# Patient Record
Sex: Male | Born: 1985 | Hispanic: No | Marital: Married | Smoking: Former smoker
Health system: Southern US, Community
[De-identification: ages and names within clinical notes are randomized; demographics above are authoritative.]

## PROBLEM LIST (undated history)

## (undated) ENCOUNTER — Ambulatory Visit: Admission: EM | Disposition: A | Discharge status: Home / Self Care | Payer: Medicaid Other

---

## 2012-09-27 ENCOUNTER — Emergency Department (HOSPITAL_BASED_OUTPATIENT_CLINIC_OR_DEPARTMENT_OTHER): Payer: Medicaid Other

## 2012-09-27 ENCOUNTER — Encounter (HOSPITAL_BASED_OUTPATIENT_CLINIC_OR_DEPARTMENT_OTHER): Payer: Self-pay | Admitting: *Deleted

## 2012-09-27 ENCOUNTER — Emergency Department (HOSPITAL_BASED_OUTPATIENT_CLINIC_OR_DEPARTMENT_OTHER)
Admission: EM | Admit: 2012-09-27 | Discharge: 2012-09-27 | Disposition: A | Discharge status: Home / Self Care | Payer: Medicaid Other | Attending: Emergency Medicine | Admitting: Emergency Medicine

## 2012-09-27 DIAGNOSIS — F17200 Nicotine dependence, unspecified, uncomplicated: Secondary | ICD-10-CM | POA: Insufficient documentation

## 2012-09-27 DIAGNOSIS — J189 Pneumonia, unspecified organism: Secondary | ICD-10-CM

## 2012-09-27 DIAGNOSIS — J159 Unspecified bacterial pneumonia: Secondary | ICD-10-CM | POA: Insufficient documentation

## 2012-09-27 DIAGNOSIS — J3489 Other specified disorders of nose and nasal sinuses: Secondary | ICD-10-CM | POA: Insufficient documentation

## 2012-09-27 DIAGNOSIS — R0989 Other specified symptoms and signs involving the circulatory and respiratory systems: Secondary | ICD-10-CM | POA: Insufficient documentation

## 2012-09-27 DIAGNOSIS — H9319 Tinnitus, unspecified ear: Secondary | ICD-10-CM | POA: Insufficient documentation

## 2012-09-27 MED ORDER — IOHEXOL 300 MG/ML  SOLN
80.0000 mL | Freq: Once | INTRAMUSCULAR | Status: AC | PRN
  Administered 2012-09-27: 80 mL via INTRAVENOUS

## 2012-09-27 MED ORDER — AZITHROMYCIN 250 MG PO TABS: 250.0000 mg | ORAL_TABLET | Freq: Every day | ORAL | Status: AC

## 2012-09-27 NOTE — ED Provider Notes (Signed)
Medical screening examination/treatment/procedure(s) were performed by non-physician practitioner and as supervising physician I was immediately available for consultation/collaboration.   Rolan Bucco, MD 09/27/12 [phone]

## 2012-09-27 NOTE — ED Provider Notes (Signed)
History    CSN: [id number] Arrival date & time 09/27/12  1609  First MD Initiated Contact with Patient 09/27/12 1623     Chief Complaint  Patient presents with  . URI   (Consider location/radiation/quality/duration/timing/severity/associated sxs/prior Treatment) HPI Comments: Patient presents to emergency department with chief complaint of nasal congestion, tinnitus, and chest congestion x2 weeks. He denies running any fever, but does endorse productive green sputum. He states that he is tried taking his friend's amoxicillin with some relief. He only took it for 6 days. He has also tried OTC cough and cold medicine with some relief. However, he states that the nasal congestion and sinus pressure has been worsening. He denies any other health problems  The history is provided by the patient. No language interpreter was used.   History reviewed. No pertinent past medical history. History reviewed. No pertinent past surgical history. No family history on file. History  Substance Use Topics  . Smoking status: Current Every Day Smoker -- 0.50 packs/day    Types: Cigarettes  . Smokeless tobacco: Not on file  . Alcohol Use: No    Review of Systems  All other systems reviewed and are negative.    Allergies  Review of patient's allergies indicates no known allergies.  Home Medications   Current Outpatient Rx  Name  Route  Sig  Dispense  Refill  . AMOXICILLIN PO   Oral   Take by mouth.          BP 129/79  Pulse 80  Temp(Src) 98.9 F (37.2 C) (Oral)  Resp 18  Ht 5\' 6"  (1.676 m)  Wt 135 lb (61.236 kg)  BMI 21.8 kg/m2  SpO2 99% Physical Exam  Nursing note and vitals reviewed. Constitutional: He is oriented to person, place, and time. He appears well-developed and well-nourished.  HENT:  Head: Normocephalic and atraumatic.  Right Ear: External ear normal.  Left Ear: External ear normal.  Nose: Nose normal.  Mouth/Throat: Oropharynx is clear and moist. No  oropharyngeal exudate.  Swollen, erythematous turbinates, maxillary sinuses tender to palpation  Eyes: Conjunctivae and EOM are normal. Pupils are equal, round, and reactive to light. Right eye exhibits no discharge. Left eye exhibits no discharge. No scleral icterus.  Neck: Normal range of motion. Neck supple. No JVD present.  Cardiovascular: Normal rate, regular rhythm, normal heart sounds and intact distal pulses.  Exam reveals no gallop and no friction rub.   No murmur heard. Pulmonary/Chest: Effort normal and breath sounds normal. No respiratory distress. He has no wheezes. He has no rales. He exhibits no tenderness.  Abdominal: Soft. Bowel sounds are normal. He exhibits no distension and no mass. There is no tenderness. There is no rebound and no guarding.  Musculoskeletal: Normal range of motion. He exhibits no edema and no tenderness.  Neurological: He is alert and oriented to person, place, and time.  Skin: Skin is warm and dry.  Psychiatric: He has a normal mood and affect. His behavior is normal. Judgment and thought content normal.    ED Course  Procedures (including critical care time) No results found for this or any previous visit. Dg Chest 2 View  09/27/2012   *RADIOLOGY REPORT*  Clinical Data: Cough, congestion for 1 week.  History of smoking.  CHEST - 2 VIEW  Comparison: None.  Findings: Cardiomediastinal silhouette is within normal limits. The lungs are free of focal consolidations and pleural effusions. At the right lung base, there is question of a pulmonary nodule, located posteriorly  on the lateral view.  Further evaluation with chest CT is recommended.  Bony structures have a normal appearance.  IMPRESSION:  1.  Question of right lower lobe pulmonary nodule.  CT of the chest with contrast is recommended for further evaluation. 2.  No focal consolidations or pulmonary edema.   Original Report Authenticated By: Norva Pavlov, M.D.   Ct Chest W Contrast  09/27/2012    *RADIOLOGY REPORT*  Clinical Data: 27 year old patient with cough and congestion for 1 week.  Possible nodules seen on today's chest radiograph.  CT CHEST WITH CONTRAST  Technique:  Multidetector CT imaging of the chest was performed following the standard protocol during bolus administration of intravenous contrast.  Contrast: 80mL OMNIPAQUE IOHEXOL 300 MG/ML  SOLN  Comparison: 09/27/2012 chest radiograph  Findings: Normal heart size.  Thoracic aorta and proximal great vessels are normal in caliber and enhancement.  Negative for mediastinal, supraclavicular, hilar, or axillary lymphadenopathy. Negative for pleural or pericardial effusion.  Soft tissues of the chest wall are normal.  In the inferior right lower lobe there are clustered micronodules, as well as two larger nodules posteriorly. The largest nodule is positioned adjacent to the hemidiaphragm measures 10 x 10 mm and is felt to account for the nodule seen on chest radiograph.  The next largest nodular density measures approximately 7 x 5 mm on image number 55 of the lung windows. These are favored to be airspace nodules related to infection or inflammation.  The right upper lobe, right middle lobe, and left lung are well expanded and clear.  The trachea and mainstem bronchi are patent.  No acute findings are identified in the visualized portion of the upper abdomen.  No acute or suspicious bony abnormality.  IMPRESSION: Cluster of nodular densities in the right lower lobe inferiorly are likely due to focal infection or inflammation.  The largest nodule measures 10 x 10 mm and is felt to account for the abnormality seen on the chest radiograph.  Follow-up noncontrast chest CT in 2-3 months is suggested following medical therapy for patient's cough.   Original Report Authenticated By: Britta Mccreedy, M.D.     1. CAP (community acquired pneumonia)     MDM  Patient with cough x2 weeks, also complaining of green productive sputum. Will order a chest x-ray  to evaluate for possible pneumonia.  5:36 PM Chest x-ray shows a concerning lower lung nodule, and radiologist recommended CT scan for further evaluation. CT is pending. Discussed with Dr. Fredderick Phenix, who agrees with the plan.  6:18 PM CT shows evidence of pneumonia. Patient discussed with Dr. Fredderick Phenix, who recommends azithromycin. Patient is stable and ready for discharge. Return precautions have been given.  Roxy Horseman, PA-C 09/27/12 1819

## 2012-09-27 NOTE — ED Notes (Addendum)
Congestion, cough and ear pain. Has been taking a friends Amoxicillin x 6 days.

## 2014-03-06 IMAGING — CT CT CHEST W/ CM
2 of 3 series · 15 of 36 positions shown, 18 images · IV contrast (omnipaque)
Comparison: 09/27/2012 chest radiograph

CLINICAL DATA: 26-year-old patient with cough and congestion for 1
week.  Possible nodules seen on today's chest radiograph.

CT CHEST WITH CONTRAST
TECHNIQUE: Multidetector CT imaging of the chest was performed
following the standard protocol during bolus administration of
intravenous contrast.
Contrast: 80mL OMNIPAQUE IOHEXOL 300 MG/ML  SOLN

[Series 2: chest 5.0 b31f · axial · 0.77mm/px · z∈[-328,-28]mm · 12 of 72 slices shown, 15 images]
[im 6/72  mediastinal]
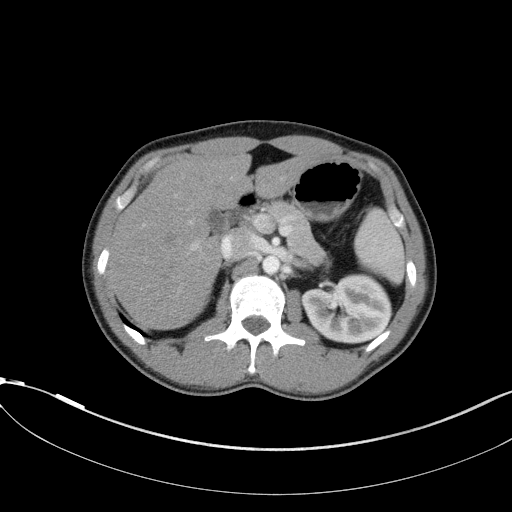
[im 6/72  lung]
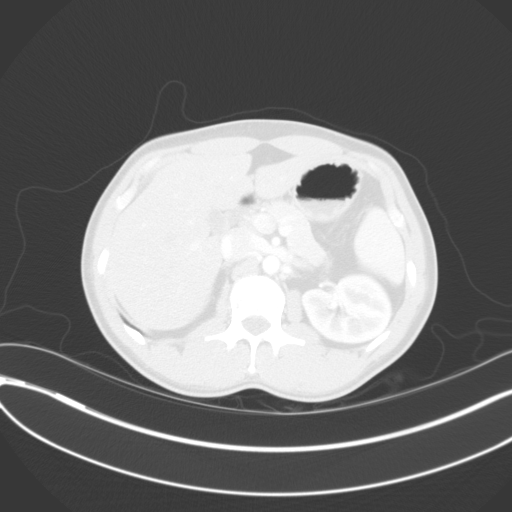
[im 11/72  lung]
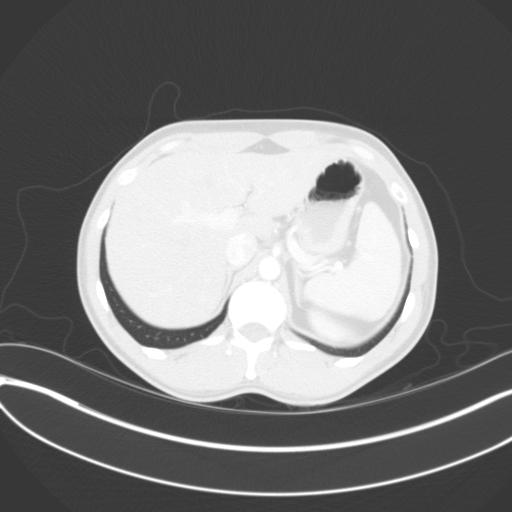
[im 16/72  lung]
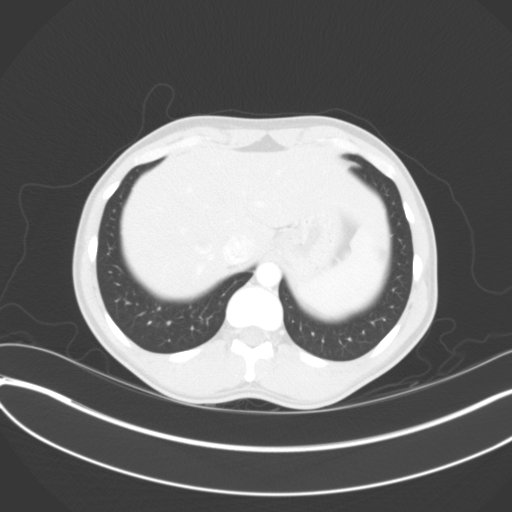
[im 22/72  lung]
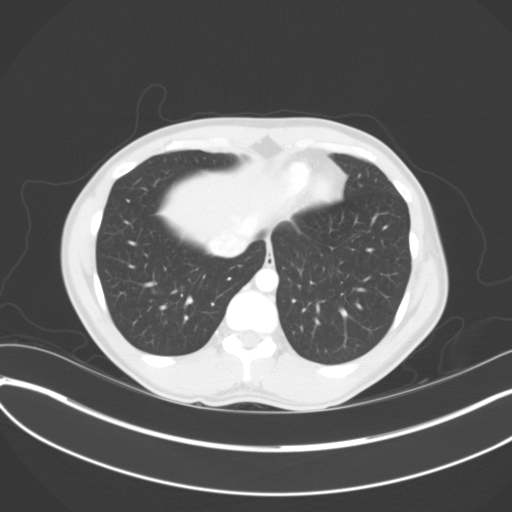
[im 27/72  mediastinal]
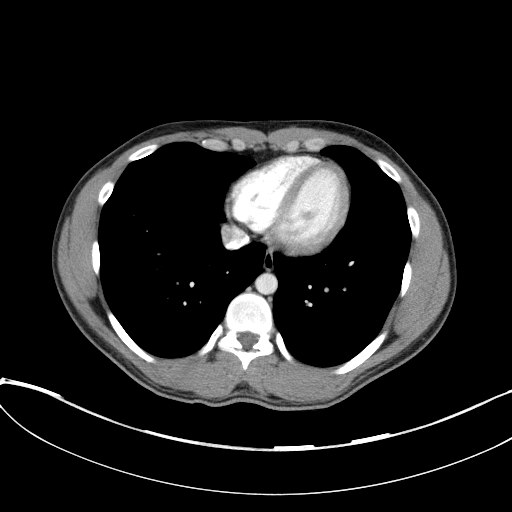
[im 27/72  lung]
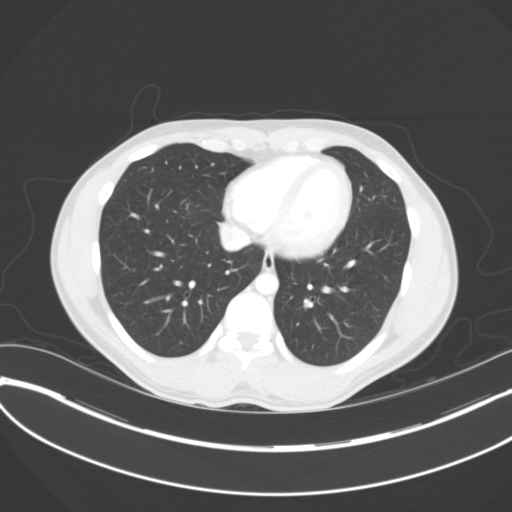
[im 32/72  lung]
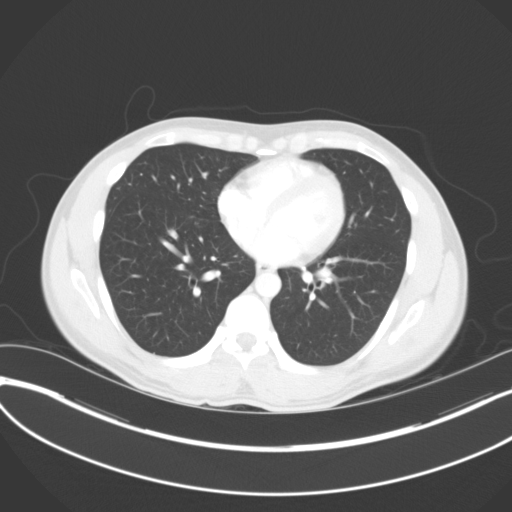
[im 40/72  lung]
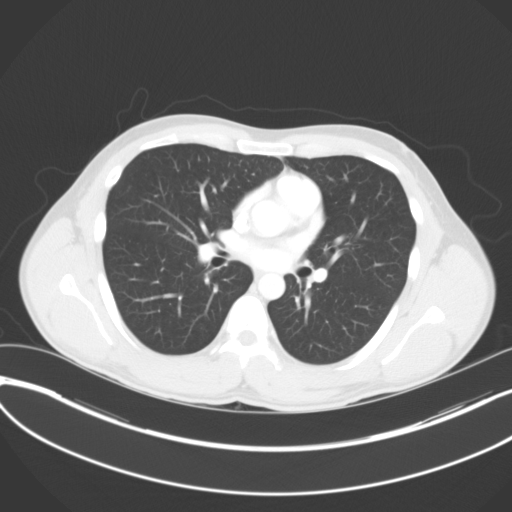
[im 45/72  lung]
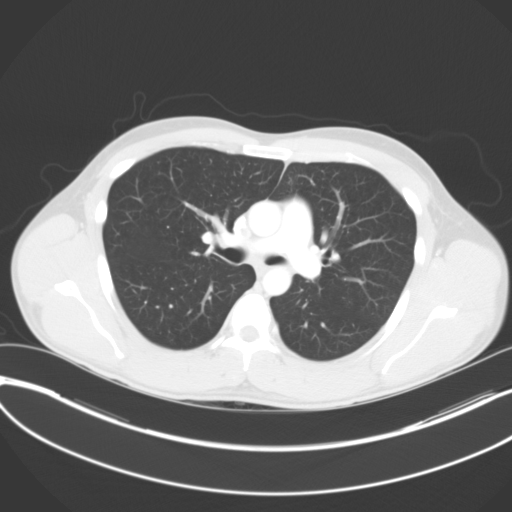
[im 50/72  mediastinal]
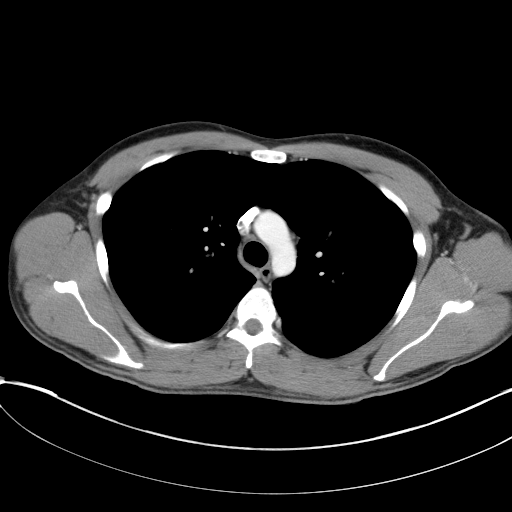
[im 50/72  lung]
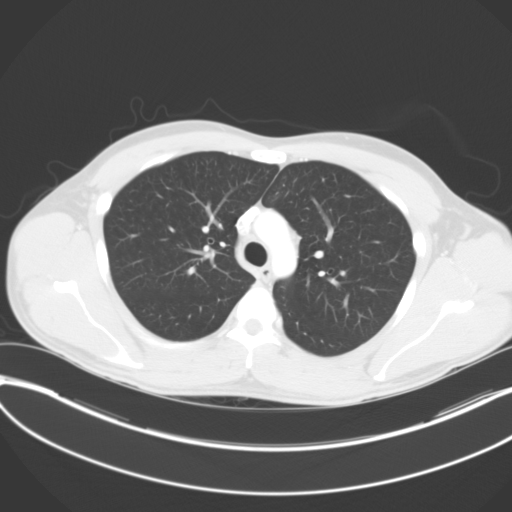
[im 56/72  lung]
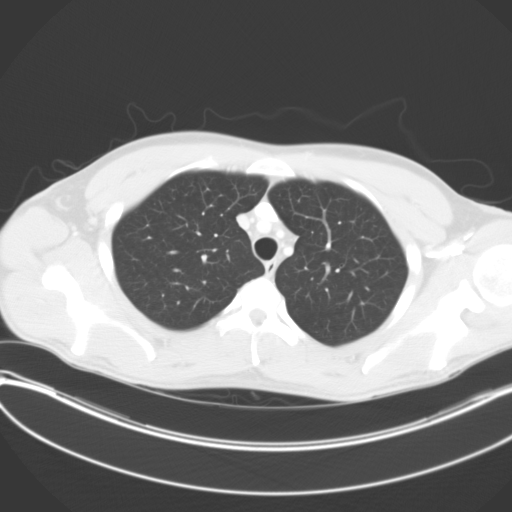
[im 61/72  lung]
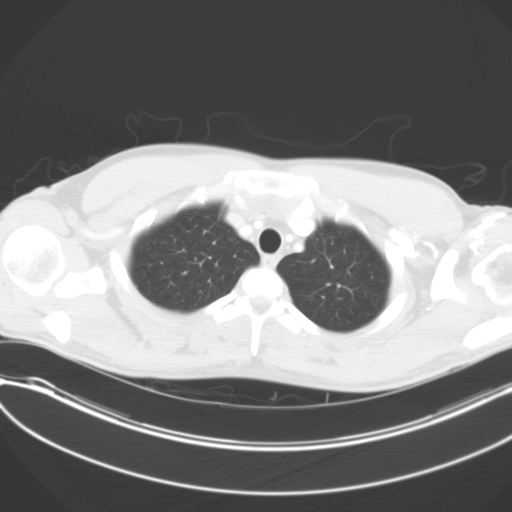
[im 66/72  lung]
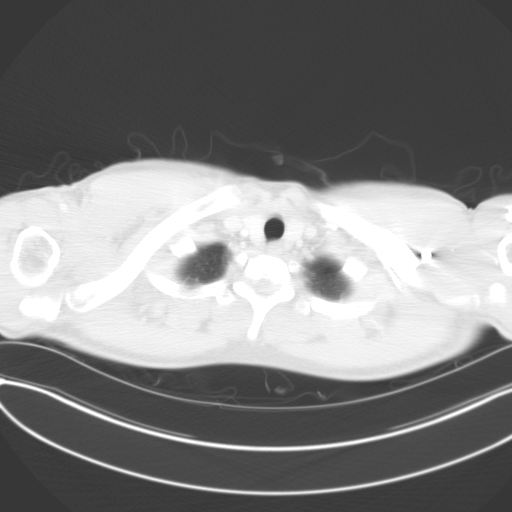

[Series 6: chest 3.0 coronal · coronal · 0.70mm/px · 3 of 75 slices shown]
[im 15/75  lung]
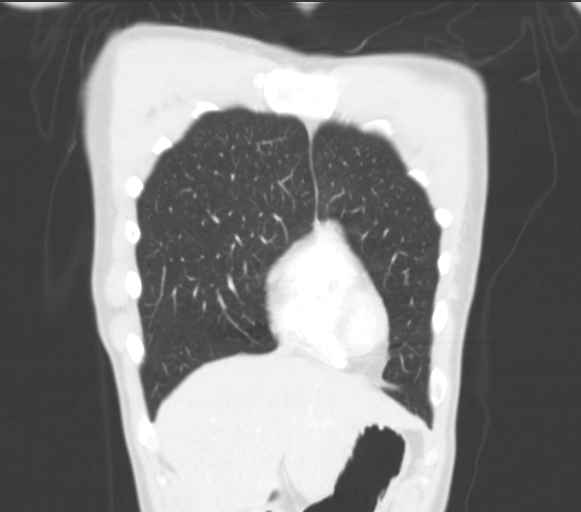
[im 30/75  lung]
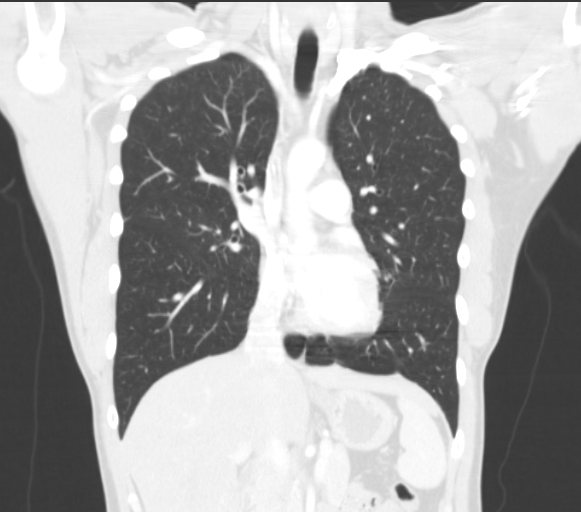
[im 45/75  lung]
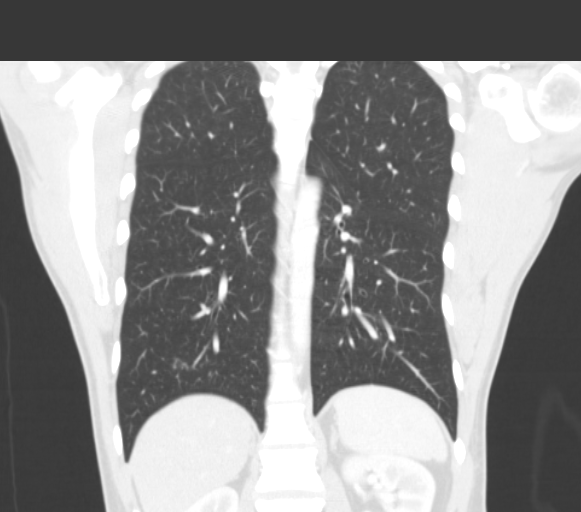

[15 of 36 positions shown; findings below may reference images not displayed]

FINDINGS: Normal heart size.  Thoracic aorta and proximal great
vessels are normal in caliber and enhancement.  Negative for
mediastinal, supraclavicular, hilar, or axillary lymphadenopathy.
Negative for pleural or pericardial effusion.  Soft tissues of the
chest wall are normal.

In the inferior right lower lobe there are clustered micronodules,
as well as two larger nodules posteriorly. The largest nodule is
positioned adjacent to the hemidiaphragm measures 10 x 10 mm and is
felt to account for the nodule seen on chest radiograph.  The next
largest nodular density measures approximately 7 x 5 mm on image
number 55 of the lung windows. These are favored to be airspace
nodules related to infection or inflammation.

The right upper lobe, right middle lobe, and left lung are well
expanded and clear.  The trachea and mainstem bronchi are patent.

No acute findings are identified in the visualized portion of the
upper abdomen.

No acute or suspicious bony abnormality.
IMPRESSION: Cluster of nodular densities in the right lower lobe
inferiorly are likely due to focal infection or inflammation.  The
largest nodule measures 10 x 10 mm and is felt to account for the
abnormality seen on the chest radiograph.  Follow-up noncontrast
chest CT in 2-3 months is suggested following medical therapy for
patient's cough.

## 2017-07-07 ENCOUNTER — Encounter (HOSPITAL_BASED_OUTPATIENT_CLINIC_OR_DEPARTMENT_OTHER): Payer: Self-pay

## 2017-07-07 ENCOUNTER — Emergency Department (HOSPITAL_BASED_OUTPATIENT_CLINIC_OR_DEPARTMENT_OTHER)
Admission: EM | Admit: 2017-07-07 | Discharge: 2017-07-07 | Disposition: A | Discharge status: Home / Self Care | Payer: Medicaid Other | Attending: Emergency Medicine | Admitting: Emergency Medicine

## 2017-07-07 ENCOUNTER — Emergency Department (HOSPITAL_BASED_OUTPATIENT_CLINIC_OR_DEPARTMENT_OTHER): Payer: Medicaid Other

## 2017-07-07 ENCOUNTER — Other Ambulatory Visit: Payer: Self-pay

## 2017-07-07 DIAGNOSIS — Z87891 Personal history of nicotine dependence: Secondary | ICD-10-CM | POA: Insufficient documentation

## 2017-07-07 DIAGNOSIS — S61211A Laceration without foreign body of left index finger without damage to nail, initial encounter: Secondary | ICD-10-CM | POA: Diagnosis not present

## 2017-07-07 DIAGNOSIS — Y929 Unspecified place or not applicable: Secondary | ICD-10-CM | POA: Insufficient documentation

## 2017-07-07 DIAGNOSIS — Y999 Unspecified external cause status: Secondary | ICD-10-CM | POA: Insufficient documentation

## 2017-07-07 DIAGNOSIS — W260XXA Contact with knife, initial encounter: Secondary | ICD-10-CM | POA: Diagnosis not present

## 2017-07-07 DIAGNOSIS — Y9389 Activity, other specified: Secondary | ICD-10-CM | POA: Insufficient documentation

## 2017-07-07 MED ORDER — LIDOCAINE HCL 2 % IJ SOLN
INTRAMUSCULAR | Status: AC
  Filled 2017-07-07: qty 20

## 2017-07-07 MED ORDER — TETANUS-DIPHTH-ACELL PERTUSSIS 5-2.5-18.5 LF-MCG/0.5 IM SUSP
INTRAMUSCULAR | Status: AC
  Filled 2017-07-07: qty 0.5

## 2017-07-07 MED ORDER — TETANUS-DIPHTH-ACELL PERTUSSIS 5-2.5-18.5 LF-MCG/0.5 IM SUSP
0.5000 mL | Freq: Once | INTRAMUSCULAR | Status: AC
  Administered 2017-07-07: 0.5 mL via INTRAMUSCULAR

## 2017-07-07 NOTE — ED Provider Notes (Signed)
[facility] EMERGENCY DEPARTMENT Provider Note   CSN: [id number] Arrival date & time: 07/07/17  1445     History   Chief Complaint Chief Complaint  Patient presents with  . Finger Injury    HPI Omar Hayes is a 32 y.o. right-handed male who presents with laceration to L index finger that occurred just PTA. He reports he was trying to cut plastic with a knife and slipped and hit his finger. He has had associated paresthesia to the finger. Bleeding was controlled quickly with pressure. He denies any other injury. His tetanus is not UTD.  HPI  History reviewed. No pertinent past medical history.  There are no active problems to display for this patient.   History reviewed. No pertinent surgical history.      Home Medications    Prior to Admission medications   Medication Sig Start Date End Date Taking? Authorizing Provider  AMOXICILLIN PO Take by mouth.    [provider]  azithromycin (ZITHROMAX Z-PAK) 250 MG tablet Take 1 tablet (250 mg total) by mouth daily.  PO day 1, then  PO days 205 09/27/12   Roxy Horseman, PA-C    Family History No family history on file.  Social History Social History   Tobacco Use  . Smoking status: Former Smoker    Packs/day: 0.50    Types: Cigarettes  . Smokeless tobacco: Never Used  Substance Use Topics  . Alcohol use: No  . Drug use: No     Allergies   Patient has no known allergies.   Review of Systems Review of Systems  Constitutional: Negative for chills and fever.  HENT: Negative for facial swelling and sore throat.   Respiratory: Negative for shortness of breath.   Cardiovascular: Negative for chest pain.  Gastrointestinal: Negative for abdominal pain, nausea and vomiting.  Genitourinary: Negative for dysuria.  Musculoskeletal: Negative for back pain.  Skin: Positive for wound. Negative for rash.  Neurological: Positive for numbness (paresthesia). Negative for headaches.    Psychiatric/Behavioral: The patient is not nervous/anxious.      Physical Exam Updated Vital Signs BP 126/84 (BP Location: Right Arm)   Pulse 68   Temp 98.2 F (36.8 C) (Oral)   Resp 16   Ht  (1.676 m)   Wt 73.8 kg (162 lb 11.2 oz)   SpO2 100%   BMI 26.26 kg/m   Physical Exam  Constitutional: He appears well-developed and well-nourished. No distress.  HENT:  Head: Normocephalic and atraumatic.  Mouth/Throat: Oropharynx is clear and moist. No oropharyngeal exudate.  Eyes: Pupils are equal, round, and reactive to light. Conjunctivae are normal. Right eye exhibits no discharge. Left eye exhibits no discharge. No scleral icterus.  Neck: Normal range of motion. Neck supple. No thyromegaly present.  Cardiovascular: Normal rate, regular rhythm, normal heart sounds and intact distal pulses. Exam reveals no gallop and no friction rub.  No murmur heard. Pulmonary/Chest: Effort normal and breath sounds normal. No stridor. No respiratory distress. He has no wheezes. He has no rales.  Abdominal: Soft. Bowel sounds are normal. He exhibits no distension. There is no tenderness. There is no rebound and no guarding.  Musculoskeletal: He exhibits tenderness. He exhibits no edema.  Linear laceration with visible adipose to the palmar aspect of the L index finger 1 cm distal to and parallel with the DIP; FROM with flexion and extension of the PIP and DIP joints of the finer, cap refill <2 secs, sensation intact  Lymphadenopathy:  He has no cervical adenopathy.  Neurological: He is alert. Coordination normal.  Skin: Skin is warm and dry. No rash noted. He is not diaphoretic. No pallor.  Psychiatric: He has a normal mood and affect.  Nursing note and vitals reviewed.    ED Treatments / Results  Labs (all labs ordered are listed, but only abnormal results are displayed) Labs Reviewed - No data to display  EKG None  Radiology Dg Finger Index Left  Addendum Date: 07/07/2017    ADDENDUM REPORT: 07/07/2017 16:06 ADDENDUM: A repeat frontal view was obtained with the gauze removed. This demonstrates a thin soft tissue laceration in the radial aspect of the index finger at the level of the base of the 2nd distal phalanx. No fracture or radiopaque foreign body is seen. Electronically Signed   By: Beckie Salts M.D.   On: 07/07/2017 16:06   Result Date: 07/07/2017 CLINICAL DATA:  Distal left index finger laceration from a plastic injury. EXAM: LEFT INDEX FINGER 2+V COMPARISON:  None. FINDINGS: The examination is limited by overlying gauze. This makes it difficult to exclude a fracture on the AP view. No fracture seen on the other views. No dislocation. IMPRESSION: Limited examination due to overlying gauze with no definite fracture seen. A repeat examination is recommended once the gauze has been removed. Electronically Signed: By: Beckie Salts M.D. On: 07/07/2017 15:19    Procedures .Marland KitchenLaceration Repair Date/Time: 07/07/2017 5:10 PM Performed by: Emi Holes, PA-C Authorized by: Emi Holes, PA-C   Consent:    Consent obtained:  Verbal   Consent given by:  Patient   Risks discussed:  Infection and pain   Alternatives discussed:  No treatment Anesthesia (see MAR for exact dosages):    Anesthesia method:  Nerve block   Block location:  Digital- L index   Block needle gauge:  25 G   Block anesthetic:  Lidocaine 2% w/o epi   Block technique:  Transthecal   Block injection procedure:  Anatomic landmarks identified, introduced needle, incremental injection, anatomic landmarks palpated and negative aspiration for blood   Block outcome:  Anesthesia achieved Laceration details:    Location:  Finger   Finger location:  L index finger   Length (cm):  2 Repair type:    Repair type:  Simple Pre-procedure details:    Preparation:  Patient was prepped and draped in usual sterile fashion and imaging obtained to evaluate for foreign bodies Exploration:    Hemostasis  achieved with:  Direct pressure   Wound exploration: wound explored through full range of motion and entire depth of wound probed and visualized     Wound extent: no foreign bodies/material noted, no muscle damage noted, no nerve damage noted and no tendon damage noted     Contaminated: no   Treatment:    Area cleansed with:  Saline   Amount of cleaning:  Standard   Irrigation solution:  Sterile saline   Irrigation volume:  200 cc   Irrigation method:  Syringe   Visualized foreign bodies/material removed: no   Skin repair:    Repair method:  Sutures   Suture size:  5-0   Wound skin closure material used: Ethilon.   Suture technique:  Simple interrupted   Number of sutures:  6 Approximation:    Laceration repair approximation: intermediate. Post-procedure details:    Dressing:  Antibiotic ointment, non-adherent dressing and splint for protection   Patient tolerance of procedure:  Tolerated well, no immediate complications   (including critical  care time)  Medications Ordered in ED Medications  lidocaine (XYLOCAINE) 2 % (with pres) injection (has no administration in time range)  Tdap (BOOSTRIX) injection 0.5 mL (0.5 mLs Intramuscular Given 07/07/17 1500)     Initial Impression / Assessment and Plan / ED Course  I have reviewed the triage vital signs and the nursing notes.  Pertinent labs & imaging results that were available during my care of the patient were reviewed by me and considered in my medical decision making (see chart for details).     Patient with L index finger laceration. X-ray is negative for fracture. FROM of the digit. Tetanus updated in ED. Laceration occurred < 12 hours prior to repair. Static finger splint provided for protection. Discussed laceration care with pt and answered questions. Pt to f-u for suture removal in 10 days and wound check sooner should there be signs of dehiscence or infection. Pt is hemodynamically stable with no complaints prior to dc.   Patient understands and agrees with plan. Patient vitals stable throughout ED course and discharged in satisfactory condition.   Final Clinical Impressions(s) / ED Diagnoses   Final diagnoses:  Laceration of left index finger without foreign body without damage to nail, initial encounter    ED Discharge Orders    None       Emi Holes, PA-C 07/07/17 1713    Alvira Monday, MD 07/09/17 1324

## 2017-07-07 NOTE — ED Notes (Signed)
Pt verbalizes understanding of d/c instructions and denies any further needs at this time. 

## 2017-07-07 NOTE — Discharge Instructions (Addendum)
Treatment: Keep your wound dry and dressing applied until this time tomorrow. After 24 hours, you may wash with warm soapy water. Dry and apply antibiotic ointment and clean dressing. Do this daily until your sutures are removed.  You can take the pain medications you have at home, as needed for your pain. Wear splint for 3-4 days.  Follow-up: Please follow-up with your primary care provider or return to emergency department in 10 days for suture removal. Be aware of signs of infection: fever, increasing pain, redness, swelling, drainage from the area. Please call your primary care provider or return to emergency department if you develop any of these symptoms or if any of the sutures come out prior to removal. Please return to the emergency department if you develop any other new or worsening symptoms.

## 2017-07-07 NOTE — ED Triage Notes (Signed)
Pt states he cut left index finger with a knife while cutting a box 30 min PTA-NAD-steady gait

## 2018-12-14 IMAGING — CR DG FINGER INDEX 2+V*L*
1 series · 1 of 1 positions shown · non-contrast
Comparison: None.

ADDENDUM:
A repeat frontal view was obtained with the gauze removed. This
demonstrates a thin soft tissue laceration in the radial aspect of
the index finger at the level of the base of the 2nd distal phalanx.
No fracture or radiopaque foreign body is seen.
CLINICAL DATA: Distal left index finger laceration from a plastic
injury.

EXAM:
LEFT INDEX FINGER 2+V

[x finger pa left]
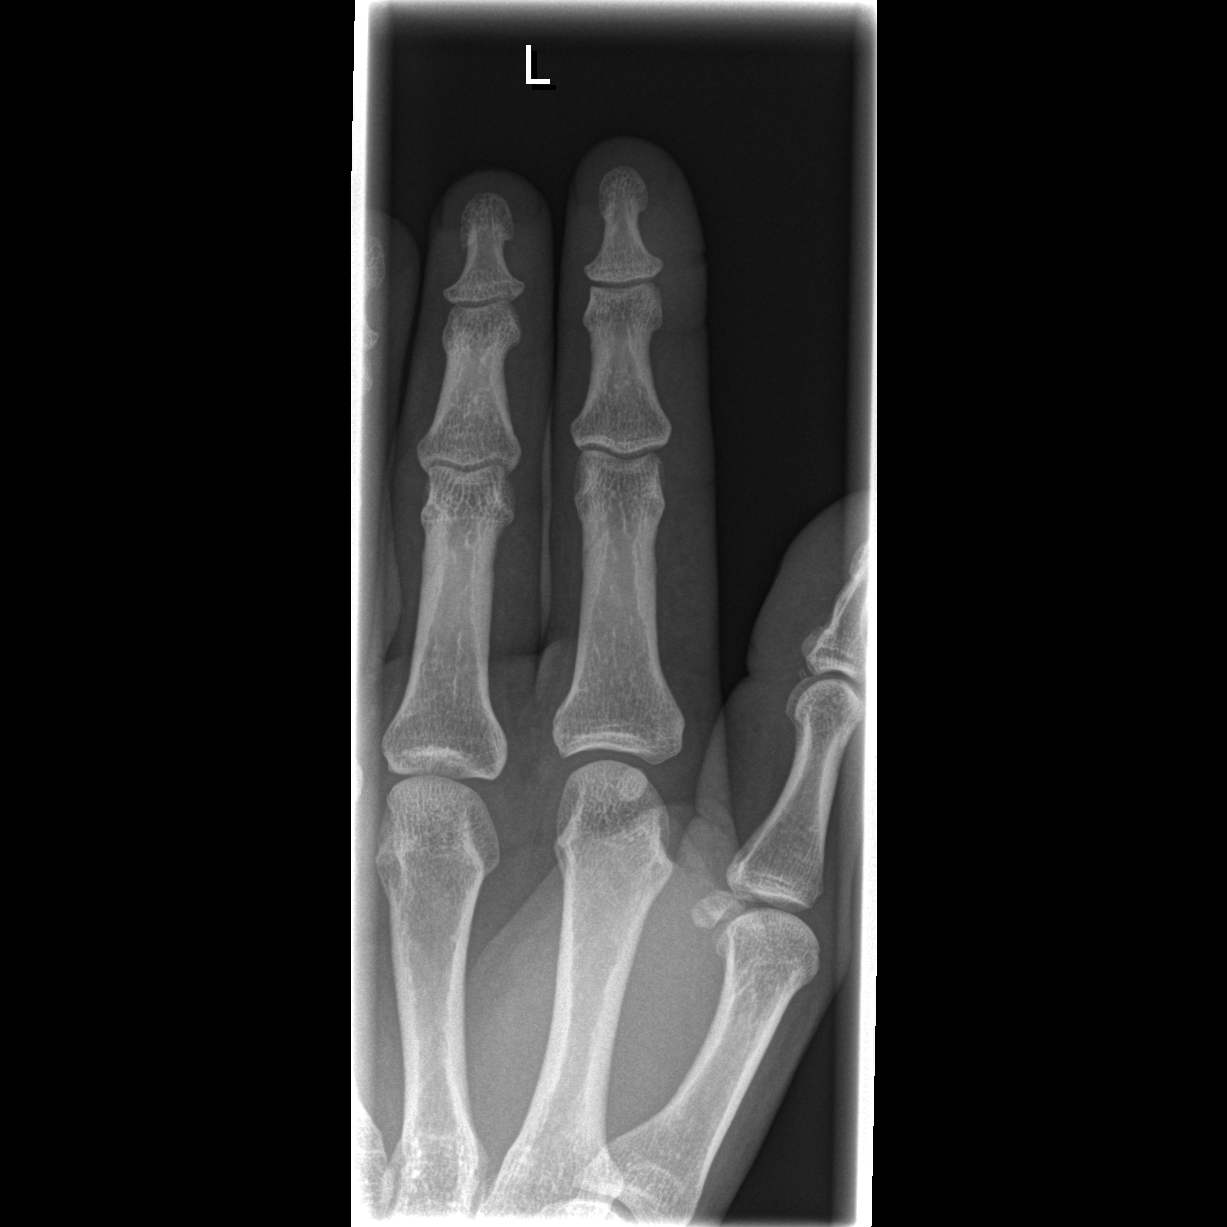

[1 of 1 positions shown; findings below may reference images not displayed]

FINDINGS: The examination is limited by overlying gauze. This makes it
difficult to exclude a fracture on the AP view. No fracture seen on
the other views. No dislocation.
IMPRESSION: Limited examination due to overlying gauze with no definite fracture
seen. A repeat examination is recommended once the gauze has been
removed.

## 2020-08-07 ENCOUNTER — Encounter (HOSPITAL_BASED_OUTPATIENT_CLINIC_OR_DEPARTMENT_OTHER): Payer: Self-pay | Admitting: *Deleted

## 2020-08-07 ENCOUNTER — Other Ambulatory Visit: Payer: Self-pay

## 2020-08-07 ENCOUNTER — Emergency Department (HOSPITAL_BASED_OUTPATIENT_CLINIC_OR_DEPARTMENT_OTHER)
Admission: EM | Admit: 2020-08-07 | Discharge: 2020-08-07 | Disposition: A | Discharge status: Home / Self Care | Payer: Medicaid Other | Attending: Emergency Medicine | Admitting: Emergency Medicine

## 2020-08-07 DIAGNOSIS — Z87891 Personal history of nicotine dependence: Secondary | ICD-10-CM | POA: Diagnosis not present

## 2020-08-07 DIAGNOSIS — R0781 Pleurodynia: Secondary | ICD-10-CM | POA: Diagnosis not present

## 2020-08-07 DIAGNOSIS — G8929 Other chronic pain: Secondary | ICD-10-CM | POA: Diagnosis not present

## 2020-08-07 DIAGNOSIS — R079 Chest pain, unspecified: Secondary | ICD-10-CM | POA: Diagnosis present

## 2020-08-07 DIAGNOSIS — R109 Unspecified abdominal pain: Secondary | ICD-10-CM | POA: Diagnosis not present

## 2020-08-07 LAB — URINALYSIS, ROUTINE W REFLEX MICROSCOPIC
Bilirubin Urine: NEGATIVE
Glucose, UA: NEGATIVE mg/dL
Hgb urine dipstick: NEGATIVE
Ketones, ur: NEGATIVE mg/dL
Leukocytes,Ua: NEGATIVE
Nitrite: NEGATIVE
Protein, ur: NEGATIVE mg/dL
Specific Gravity, Urine: >1.03 — ABNORMAL HIGH (ref 1.005–1.030)
pH: 5 (ref 5.0–8.0)

## 2020-08-07 NOTE — ED Triage Notes (Signed)
Pain in his left chest and left flank x 6 months. EKG at triage.

## 2020-08-07 NOTE — ED Provider Notes (Signed)
[facility] EMERGENCY DEPARTMENT Provider Note  CSN: [id number] Arrival date & time: 08/07/20 1637    History Chief Complaint  Patient presents with  . Chest Pain    HPI  Omar Hayes is a 35 y.o. male reports he has had constant L lower chest pain and L flank pain for at least 6 months but probably longer than that. No particular provoking or relieving factors. He has been seeing [facility] for same and reports he has had extensive workup including labs, upper endoscopy and a CT scan all in the last few weeks and all reportedly negative. He reports today his symptoms were similar but worse and so he came to the ED. He feels like 'something is growing inside' but denies any fever, cough, SOB, N/V/D/C, dysuria or hematuria. He has tried numerous medications including mylanta, pepto, tylenol. He used to take a lot of Goody powders but does not take them any longer.    History reviewed. No pertinent past medical history.  History reviewed. No pertinent surgical history.  No family history on file.  Social History   Tobacco Use  . Smoking status: Former Smoker    Packs/day: 0.50    Types: Cigarettes  . Smokeless tobacco: Never Used  Substance Use Topics  . Alcohol use: No  . Drug use: No     Home Medications Prior to Admission medications   Medication Sig Start Date End Date Taking? Authorizing Provider  AMOXICILLIN PO Take by mouth.    [provider]  azithromycin (ZITHROMAX Z-PAK) 250 MG tablet Take 1 tablet (250 mg total) by mouth daily. 500mg  PO day 1, then 250mg  PO days 205 09/27/12   , PA-C     Allergies    Patient has no known allergies.   Review of Systems   Review of Systems A comprehensive review of systems was completed and negative except as noted in HPI.    Physical Exam BP 122/82 (BP Location: Right Arm)   Pulse 78   Temp 98 F (36.7 C) (Oral)   Resp 14   Ht 5\' 6"  (1.676 m)   Wt 78.3 kg   SpO2 99%    BMI 27.87 kg/m   Physical Exam Vitals and nursing note reviewed.  Constitutional:      Appearance: Normal appearance.  HENT:     Head: Normocephalic and atraumatic.     Nose: Nose normal.     Mouth/Throat:     Mouth: Mucous membranes are moist.  Eyes:     Extraocular Movements: Extraocular movements intact.     Conjunctiva/sclera: Conjunctivae normal.  Cardiovascular:     Rate and Rhythm: Normal rate.  Pulmonary:     Effort: Pulmonary effort is normal.     Breath sounds: Normal breath sounds.  Chest:     Chest wall: Tenderness (L lower ribs) present. No mass or deformity.  Abdominal:     General: Abdomen is flat.     Palpations: Abdomen is soft. There is no mass.     Tenderness: There is no abdominal tenderness. There is no guarding.  Musculoskeletal:        General: No swelling. Normal range of motion.     Cervical back: Neck supple.  Skin:    General: Skin is warm and dry.  Neurological:     General: No focal deficit present.     Mental Status: He is alert.  Psychiatric:        Mood and Affect: Mood  normal.      ED Results / Procedures / Treatments   Labs (all labs ordered are listed, but only abnormal results are displayed) Labs Reviewed  URINALYSIS, ROUTINE W REFLEX MICROSCOPIC - Abnormal; Notable for the following components:      Result Value   Specific Gravity, Urine >1.030 (*)    All other components within normal limits    EKG EKG Interpretation  Date/Time:  Tuesday Aug 07 2020 16:45:47 EDT Ventricular Rate:  74 PR Interval:  138 QRS Duration: 94 QT Interval:  390 QTC Calculation: 432 R Axis:   93 Text Interpretation: Normal sinus rhythm Rightward axis Borderline ECG No old tracing to compare Confirmed by Susy Frizzle [phone]) on 08/07/2020 4:55:25 PM    Radiology No results found.  Procedures Procedures  Medications Ordered in the ED Medications - No data to display   MDM Rules/Calculators/A&P MDM Patient with chronic pain of  unclear etiology but reports an extensive and ongoing workup at [facility]. Exam here is benign. Normal vitals. EKG and UA without concerning findings. I explained to the patient that he does not appear to have an emergent medical condition. I do not have access to [facility] records, but by patient report he has had normal workup thusfar. He does not have any indication to repeat any of that workup in the ED today. Encouraged to call [facility] to schedule a follow up appointment to discuss what his next steps are.  ED Course  I have reviewed the triage vital signs and the nursing notes.  Pertinent labs & imaging results that were available during my care of the patient were reviewed by me and considered in my medical decision making (see chart for details).     Final Clinical Impression(s) / ED Diagnoses Final diagnoses:  Chronic chest pain  Chronic left flank pain    Rx / DC Orders ED Discharge Orders    None       Pollyann Savoy, MD 08/07/20 1742

## 2023-10-17 ENCOUNTER — Emergency Department (HOSPITAL_BASED_OUTPATIENT_CLINIC_OR_DEPARTMENT_OTHER)

## 2023-10-17 ENCOUNTER — Encounter (HOSPITAL_BASED_OUTPATIENT_CLINIC_OR_DEPARTMENT_OTHER): Payer: Self-pay | Admitting: Emergency Medicine

## 2023-10-17 ENCOUNTER — Other Ambulatory Visit: Payer: Self-pay

## 2023-10-17 ENCOUNTER — Emergency Department (HOSPITAL_BASED_OUTPATIENT_CLINIC_OR_DEPARTMENT_OTHER)
Admission: EM | Admit: 2023-10-17 | Discharge: 2023-10-17 | Disposition: A | Discharge status: Home / Self Care | Attending: Emergency Medicine | Admitting: Emergency Medicine

## 2023-10-17 DIAGNOSIS — Z87891 Personal history of nicotine dependence: Secondary | ICD-10-CM | POA: Diagnosis not present

## 2023-10-17 DIAGNOSIS — R059 Cough, unspecified: Secondary | ICD-10-CM | POA: Diagnosis present

## 2023-10-17 DIAGNOSIS — U071 COVID-19: Secondary | ICD-10-CM | POA: Diagnosis not present

## 2023-10-17 LAB — CBC WITH DIFFERENTIAL/PLATELET
Abs Immature Granulocytes: 0.01 K/uL (ref 0.00–0.07)
Basophils Absolute: 0 K/uL (ref 0.0–0.1)
Basophils Relative: 0 %
Eosinophils Absolute: 0.2 K/uL (ref 0.0–0.5)
Eosinophils Relative: 3 %
HCT: 46.2 % (ref 39.0–52.0)
Hemoglobin: 15.8 g/dL (ref 13.0–17.0)
Immature Granulocytes: 0 %
Lymphocytes Relative: 33 %
Lymphs Abs: 2.4 K/uL (ref 0.7–4.0)
MCH: 29.4 pg (ref 26.0–34.0)
MCHC: 34.2 g/dL (ref 30.0–36.0)
MCV: 86 fL (ref 80.0–100.0)
Monocytes Absolute: 0.6 K/uL (ref 0.1–1.0)
Monocytes Relative: 9 %
Neutro Abs: 3.9 K/uL (ref 1.7–7.7)
Neutrophils Relative %: 55 %
Platelets: 199 K/uL (ref 150–400)
RBC: 5.37 MIL/uL (ref 4.22–5.81)
RDW: 11.8 % (ref 11.5–15.5)
WBC: 7.2 K/uL (ref 4.0–10.5)
nRBC: 0 % (ref 0.0–0.2)

## 2023-10-17 LAB — COMPREHENSIVE METABOLIC PANEL WITH GFR
ALT: 34 U/L (ref 0–44)
AST: 22 U/L (ref 15–41)
Albumin: 4.1 g/dL (ref 3.5–5.0)
Alkaline Phosphatase: 63 U/L (ref 38–126)
Anion gap: 11 (ref 5–15)
BUN: 22 mg/dL — ABNORMAL HIGH (ref 6–20)
CO2: 24 mmol/L (ref 22–32)
Calcium: 9.2 mg/dL (ref 8.9–10.3)
Chloride: 106 mmol/L (ref 98–111)
Creatinine, Ser: 0.96 mg/dL (ref 0.61–1.24)
GFR, Estimated: >60 mL/min (ref >60)
Glucose, Bld: 92 mg/dL (ref 70–99)
Potassium: 3.9 mmol/L (ref 3.5–5.1)
Sodium: 141 mmol/L (ref 135–145)
Total Bilirubin: 0.3 mg/dL (ref 0.0–1.2)
Total Protein: 6.8 g/dL (ref 6.5–8.1)

## 2023-10-17 NOTE — ED Triage Notes (Signed)
 Pt c/o CP and cough; dx with Covid on 8/4, sts he is not improving, NAD noted in triage; chest pain is mostly when he coughs and hurts all over

## 2023-10-17 NOTE — Discharge Instructions (Addendum)
 Would recommend taking a strength Tylenol for the fever and body aches and headache.  And then supplementing with a dextro morphine type of cough medicine like the Phenergan Dextromorphine that they gave you or you could use just pure Dextromophine which is dull some 12-hour that is available over-the-counter.  In addition you could do a over-the-counter cold and flu medicine.  Continue to hydrate well.  Would expect improvement over the next few days.  Return for any new or worse symptoms.  Particularly if you are feeling short of breath.

## 2023-10-17 NOTE — ED Provider Notes (Signed)
 [place] EMERGENCY DEPARTMENT AT [facility] Provider Note   CSN: [id number] Arrival date & time: 10/17/23  1919     Patient presents with: Chest Pain and Cough   Omar Hayes is a 38 y.o. male.   Patient diagnosed with COVID on August 4.  Was treated with Phenergan dextromethorphan for the cough.  Patient is complaining of headache hurting all over cough since chest discomfort when he coughs only.  Also complaining of fevers.  Worried about having pneumonia.  Past medical history noncontributory.  Former smoker of cigarettes.  Oxygen saturation here is 99% on room air temp 98 pulse is 63 respirations 18.  Patient nontoxic no acute distress.       Prior to Admission medications   Medication Sig Start Date End Date Taking? Authorizing Provider  AMOXICILLIN PO Take by mouth.    [provider]  azithromycin  (ZITHROMAX  Z-PAK) 250 MG tablet Take 1 tablet (250 mg total) by mouth daily. 500mg  PO day 1, then 250mg  PO days 205 09/27/12   Vicky Charleston, PA-C    Allergies: Patient has no known allergies.    Review of Systems  Constitutional:  Positive for fever. Negative for chills.  HENT:  Negative for ear pain and sore throat.   Eyes:  Negative for pain and visual disturbance.  Respiratory:  Negative for cough and shortness of breath.   Cardiovascular:  Negative for chest pain and palpitations.  Gastrointestinal:  Negative for abdominal pain and vomiting.  Genitourinary:  Negative for dysuria and hematuria.  Musculoskeletal:  Positive for myalgias. Negative for arthralgias and back pain.  Skin:  Negative for color change and rash.  Neurological:  Positive for headaches. Negative for seizures and syncope.  All other systems reviewed and are negative.   Updated Vital Signs BP 120/84   Pulse 64   Temp 98 F (36.7 C)   Resp 17   Ht 1.702 m (5' 7)   Wt 77.1 kg   SpO2 100%   BMI 26.63 kg/m   Physical Exam Vitals and nursing note reviewed.   Constitutional:      General: He is not in acute distress.    Appearance: Normal appearance. He is well-developed. He is not ill-appearing.  HENT:     Head: Normocephalic and atraumatic.     Mouth/Throat:     Mouth: Mucous membranes are moist.     Pharynx: No oropharyngeal exudate or posterior oropharyngeal erythema.  Eyes:     Extraocular Movements: Extraocular movements intact.     Conjunctiva/sclera: Conjunctivae normal.     Pupils: Pupils are equal, round, and reactive to light.  Cardiovascular:     Rate and Rhythm: Normal rate and regular rhythm.     Heart sounds: No murmur heard. Pulmonary:     Effort: Pulmonary effort is normal. No respiratory distress.     Breath sounds: Normal breath sounds. No stridor. No wheezing, rhonchi or rales.  Abdominal:     Palpations: Abdomen is soft.     Tenderness: There is no abdominal tenderness.  Musculoskeletal:        General: No swelling.     Cervical back: Normal range of motion and neck supple. No rigidity.  Skin:    General: Skin is warm and dry.     Capillary Refill: Capillary refill takes less than 2 seconds.     Findings: No rash.  Neurological:     General: No focal deficit present.     Mental Status: He is alert  and oriented to person, place, and time.  Psychiatric:        Mood and Affect: Mood normal.     (all labs ordered are listed, but only abnormal results are displayed) Labs Reviewed  COMPREHENSIVE METABOLIC PANEL WITH GFR - Abnormal; Notable for the following components:      Result Value   BUN 22 (*)    All other components within normal limits  CBC WITH DIFFERENTIAL/PLATELET    EKG: EKG Interpretation Date/Time:  Saturday October 17 2023 19:38:49 EDT Ventricular Rate:  63 PR Interval:  140 QRS Duration:  103 QT Interval:  419 QTC Calculation: 429 R Axis:   89  Text Interpretation: Sinus rhythm Confirmed by Davionne Dowty [id number]) on 10/17/2023 7:41:17 PM  Radiology: [facility] Chest Port 1 View Result  Date: 10/17/2023 CLINICAL DATA:  Chest pain and cough. Diagnosed with COVID on 10/12/2023. EXAM: PORTABLE CHEST 1 VIEW COMPARISON:  09/27/2012 FINDINGS: The heart size and mediastinal contours are within normal limits. Both lungs are clear. The visualized skeletal structures are unremarkable. IMPRESSION: No active disease. Electronically Signed   By: Elspeth Bathe M.D.   On: 10/17/2023 20:13     Procedures   Medications Ordered in the ED - No data to display                                  Medical Decision Making Amount and/or Complexity of Data Reviewed Labs: ordered. Radiology: ordered.   Patient nontoxic no acute distress.  Oxygen saturation 99 to 100% in the room.  No respiratory difficulties.  Chest x-ray negative for pneumonia.  CBC white count 7.2 hemoglobin 15.8 platelets 199.  Complete metabolic panel is still pending.  If negative patient can be treated symptomatically.  Complete metabolic panel electrolytes LFTs and renal functions all normal.    Final diagnoses:  COVID    ED Discharge Orders     None          Geraldene Hamilton, MD 10/17/23 2054
